# Patient Record
Sex: Male | Born: 1955 | Race: Black or African American | Hispanic: No | Marital: Married | Smoking: Never smoker
Health system: Southern US, Community
[De-identification: ages and names within clinical notes are randomized; demographics above are authoritative.]

## PROBLEM LIST (undated history)

## (undated) DIAGNOSIS — I10 Essential (primary) hypertension: Secondary | ICD-10-CM

## (undated) DIAGNOSIS — E7800 Pure hypercholesterolemia, unspecified: Secondary | ICD-10-CM

## (undated) HISTORY — PX: SHOULDER SURGERY: SHX246

---

## 2001-01-25 ENCOUNTER — Ambulatory Visit (HOSPITAL_BASED_OUTPATIENT_CLINIC_OR_DEPARTMENT_OTHER): Admission: RE | Admit: 2001-01-25 | Discharge: 2001-01-25 | Payer: Self-pay | Admitting: Orthopedic Surgery

## 2010-04-17 ENCOUNTER — Emergency Department (HOSPITAL_COMMUNITY): Admission: EM | Admit: 2010-04-17 | Discharge: 2010-04-17 | Payer: Self-pay | Admitting: Emergency Medicine

## 2010-06-23 ENCOUNTER — Encounter: Admission: RE | Admit: 2010-06-23 | Discharge: 2010-06-23 | Payer: Self-pay | Admitting: Family Medicine

## 2010-10-22 LAB — POCT I-STAT, CHEM 8
BUN: 11 mg/dL (ref 6–23)
Calcium, Ion: 1.24 mmol/L (ref 1.12–1.32)
Creatinine, Ser: 1 mg/dL (ref 0.4–1.5)
Glucose, Bld: 103 mg/dL — ABNORMAL HIGH (ref 70–99)
TCO2: 30 mmol/L (ref 0–100)

## 2010-12-16 ENCOUNTER — Other Ambulatory Visit: Payer: Self-pay | Admitting: Family Medicine

## 2010-12-16 DIAGNOSIS — R748 Abnormal levels of other serum enzymes: Secondary | ICD-10-CM

## 2010-12-25 NOTE — Op Note (Signed)
[place]  Patient:    Chad Contreras, Chad Contreras                        MRN: [id number] Proc. Date: 01/25/01 Adm. 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  PROCEDURE:  Left shoulder examination under anesthesia with assessment and debridement of abrasive tearing rotator cuff.  Arthroscopic acromioplasty with CA ligament release.  Excision distal clavicle.  SURGEON:  Loreta Ave, M.D.  ASSISTANT:  Arlys John D. Petrarca, P.A.-C.  ANESTHESIA:  General.  ESTIMATED BLOOD LOSS:  Minimal.  SPECIMENS:  None.  CULTURES:  None.  COMPLICATIONS:  None.  DRESSINGS:  Sterile compressive with sling.  PROCEDURE:  The patient was brought to the operating room and after adequate anesthesia had been obtained the left shoulder was examined.  Full motion, good stability.  Placed in beach chair position on the shoulder positioner. Prepped and draped in the usual sterile fashion.  Three standard arthroscopic portals, anterior, posterior, lateral.  Shoulder entered with blunt obturator and distended and inspected.  Glenohumeral joint looked good in regards to articular cartilage, labrum, capsular ligamentous structures.  Biceps tendon, biceps anchor intact.  Cuff from below looked excellent.  Cannula redirected subacromially.  Picture of chronic impingement with abrasive partial thickness tearing superior cuff.  No structural deep partial tears or full thickness tears.  Type 3 acromion.  Bursa resected.  Cuff debrided.  Acromioplasty to a type 1 acromion.  CA ligament incised and partially excised with cautery and shaver.  Distal clavicle had grade 4 changes with distal clavicle osteolysis.  The lateral 7  mm sharply resected with shaver and high speed bur.  Adequate COD compression and clavicle excision confirmed viewing from all portals.  Instruments and fluid removed. Portal, shoulder and bursa injected with Marcaine.  Portals closed with 4-0 nylon.  Sterile compressive dressing applied.  Sling applied.  Anesthesia reversed and brought to recovery room.  Tolerated surgery well, no complications. DD:  01/25/01 TD:  01/25/01 Job: [id number]

## 2011-11-16 IMAGING — CR DG CERVICAL SPINE COMPLETE 4+V
5 series · 5 of 5 positions shown · non-contrast
Comparison: The

CLINICAL DATA: Neck pain for 2 weeks.  Left arm numbness and pain.
No injury

CERVICAL SPINE - COMPLETE 4+ VIEW

[w c-spine lat]
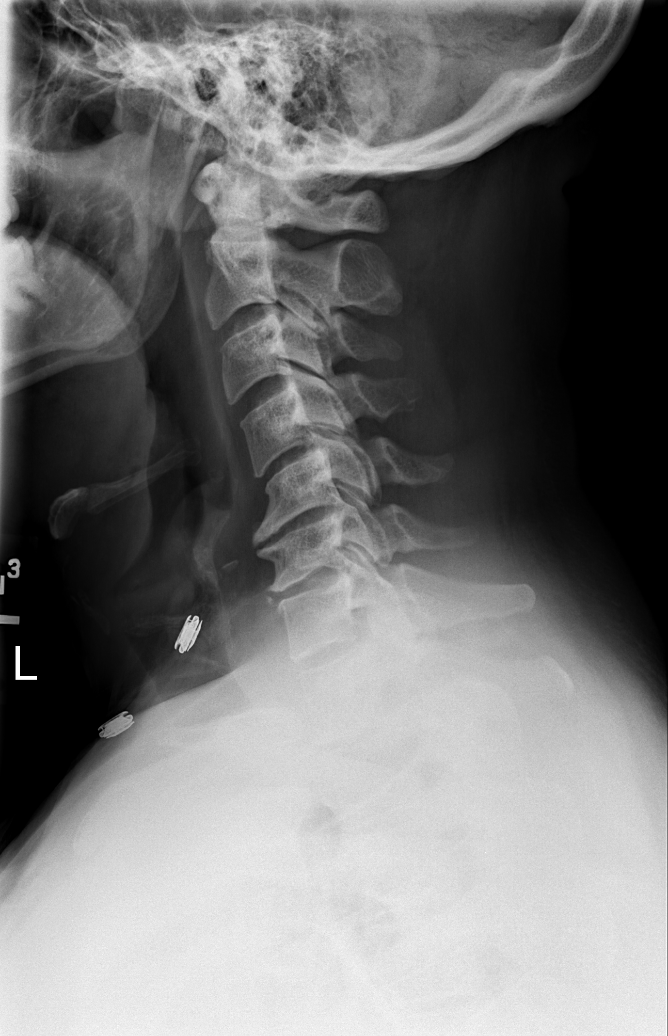

[w c-spine oblique (1 of 2)]
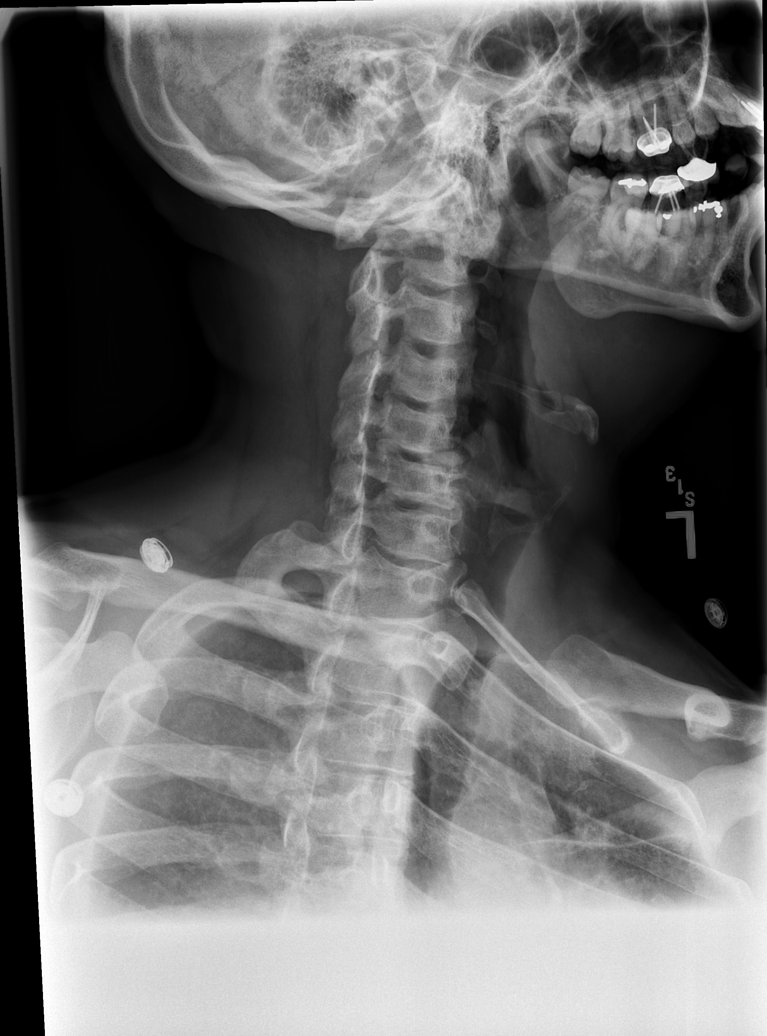

[w c-spine oblique (2 of 2)]
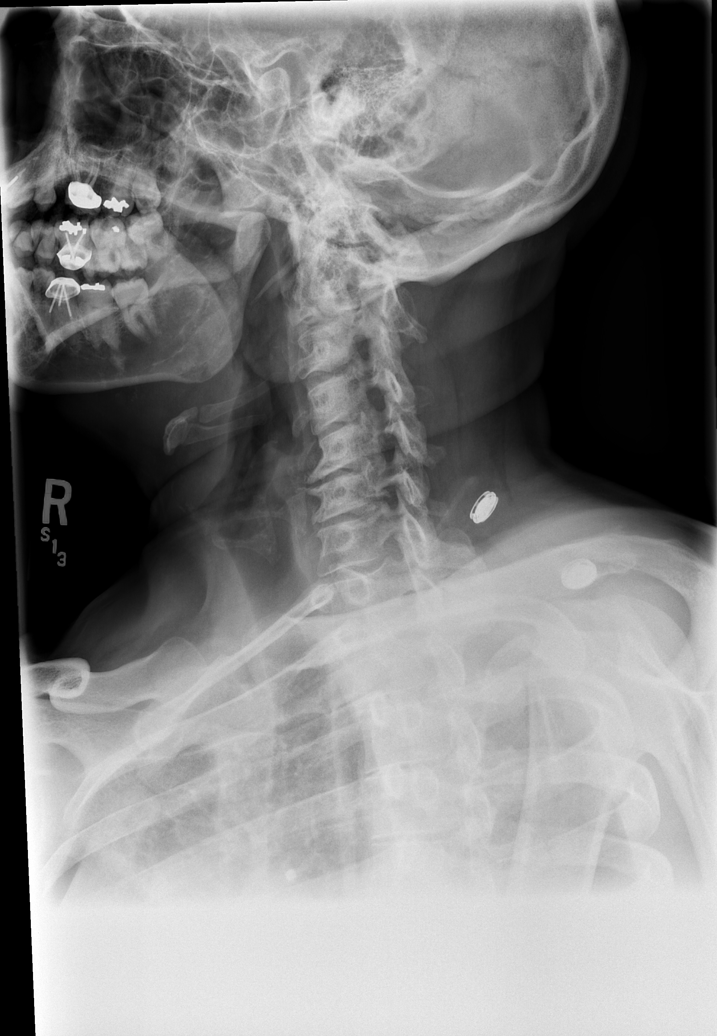

[w c-spine a.p.]
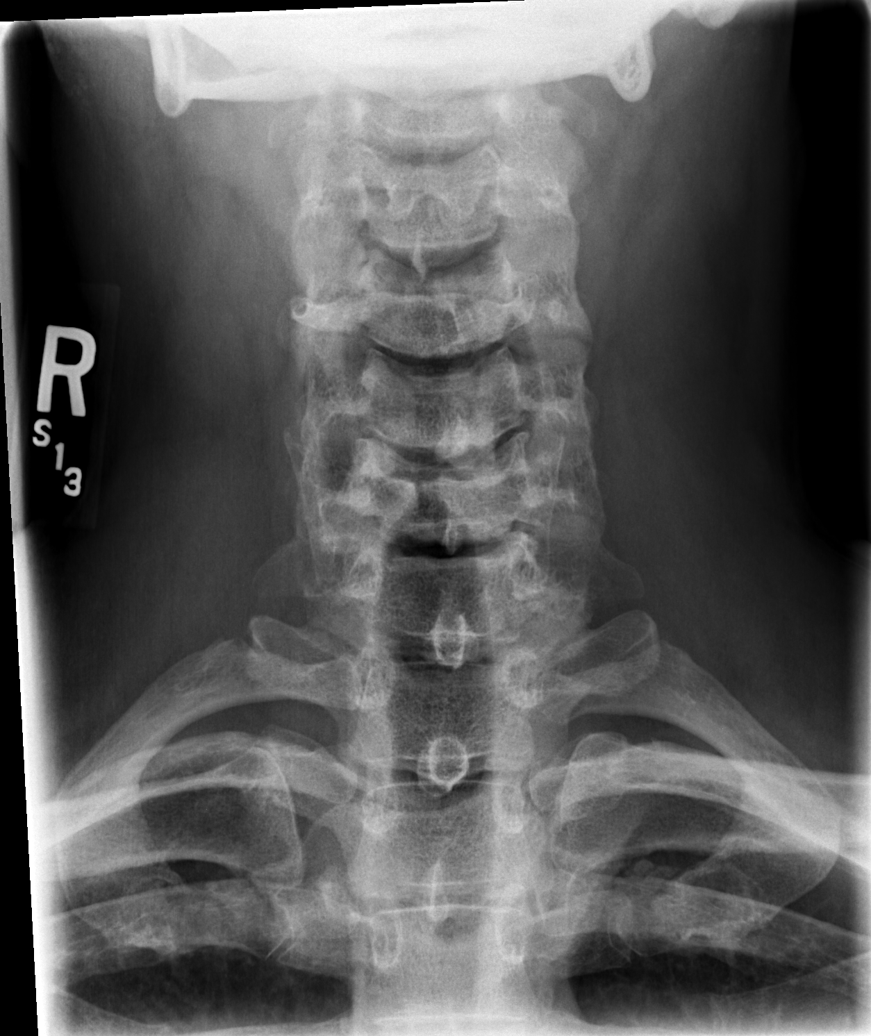

[w c-spine odontoid]
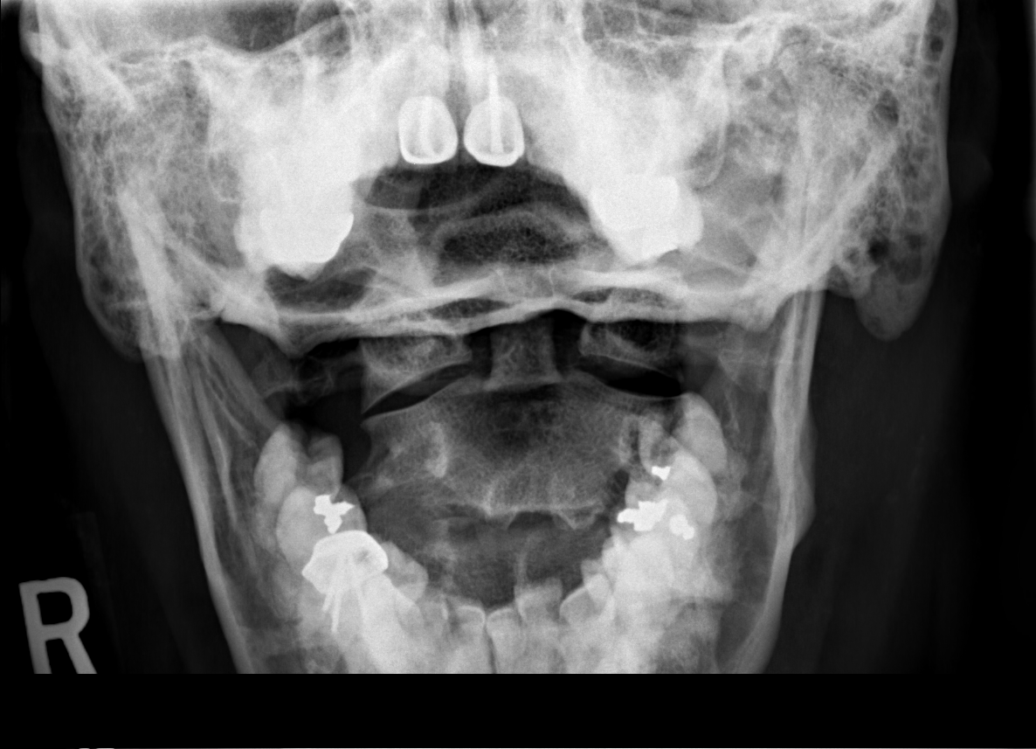

[5 of 5 positions shown; findings below may reference images not displayed]

FINDINGS: Mild kyphosis at C5-6.  There is moderate disc
degeneration and spurring at C5-6 causing mild to moderate
foraminal narrowing bilaterally.  Mild disc degeneration and
spurring at C4-5.  Left foraminal narrowing at C6-7 due to
spurring.

Negative for fracture or mass.
IMPRESSION: Cervical disc degeneration and spondylosis at C5-6 with foraminal
encroachment bilaterally due to spurring.  There is also  left
foraminal narrowing at C6-7 due to spurring.  Negative for
fracture.

## 2012-11-07 ENCOUNTER — Other Ambulatory Visit: Payer: Self-pay | Admitting: Family Medicine

## 2012-11-07 DIAGNOSIS — M7989 Other specified soft tissue disorders: Secondary | ICD-10-CM

## 2012-11-16 ENCOUNTER — Other Ambulatory Visit: Payer: Self-pay

## 2012-12-07 ENCOUNTER — Other Ambulatory Visit: Payer: Self-pay | Admitting: Family Medicine

## 2012-12-07 ENCOUNTER — Ambulatory Visit
Admission: RE | Admit: 2012-12-07 | Discharge: 2012-12-07 | Disposition: A | Discharge status: Home / Self Care | Payer: 59 | Source: Ambulatory Visit | Attending: Family Medicine | Admitting: Family Medicine

## 2012-12-07 DIAGNOSIS — M7989 Other specified soft tissue disorders: Secondary | ICD-10-CM

## 2012-12-08 ENCOUNTER — Other Ambulatory Visit: Payer: Self-pay | Admitting: Family Medicine

## 2012-12-08 DIAGNOSIS — R9389 Abnormal findings on diagnostic imaging of other specified body structures: Secondary | ICD-10-CM

## 2012-12-12 ENCOUNTER — Ambulatory Visit
Admission: RE | Admit: 2012-12-12 | Discharge: 2012-12-12 | Disposition: A | Discharge status: Home / Self Care | Payer: 59 | Source: Ambulatory Visit | Attending: Family Medicine | Admitting: Family Medicine

## 2012-12-12 DIAGNOSIS — R9389 Abnormal findings on diagnostic imaging of other specified body structures: Secondary | ICD-10-CM

## 2012-12-12 MED ORDER — GADOBENATE DIMEGLUMINE 529 MG/ML IV SOLN
20.0000 mL | Freq: Once | INTRAVENOUS | Status: AC | PRN
  Administered 2012-12-12: 20 mL via INTRAVENOUS

## 2013-04-02 ENCOUNTER — Other Ambulatory Visit: Payer: Self-pay | Admitting: Family Medicine

## 2013-04-05 ENCOUNTER — Ambulatory Visit
Admission: RE | Admit: 2013-04-05 | Discharge: 2013-04-05 | Disposition: A | Discharge status: Home / Self Care | Payer: 59 | Source: Ambulatory Visit | Attending: Family Medicine | Admitting: Family Medicine

## 2014-04-02 ENCOUNTER — Ambulatory Visit (HOSPITAL_BASED_OUTPATIENT_CLINIC_OR_DEPARTMENT_OTHER): Payer: 59 | Attending: Family Medicine

## 2014-04-02 VITALS — Ht 71.75 in | Wt 211.0 lb

## 2014-04-02 DIAGNOSIS — G4733 Obstructive sleep apnea (adult) (pediatric): Secondary | ICD-10-CM | POA: Diagnosis present

## 2014-04-06 DIAGNOSIS — G4733 Obstructive sleep apnea (adult) (pediatric): Secondary | ICD-10-CM

## 2014-04-06 NOTE — Sleep Study (Signed)
NAME: Chad Contreras DATE OF BIRTH:  May 24, 1956 MEDICAL RECORD NUMBER [id number]  LOCATION: [facility]  PHYSICIAN: YOUNG,CLINTON D  DATE OF STUDY: 04/02/2014  SLEEP STUDY TYPE: Nocturnal Polysomnogram               REFERRING PHYSICIAN: Emeterio Reeve, MD  INDICATION FOR STUDY:  hypersomnia with sleep apnea  EPWORTH SLEEPINESS SCORE:   4/24 HEIGHT: 5' 11.75" (182.2 cm)  WEIGHT: 211 lb (95.709 kg)    Body mass index is 28.83 kg/(m^2).  NECK SIZE: 17.5 in.  MEDICATIONS: Charted for review  SLEEP ARCHITECTURE: Total sleep time 297 minutes with sleep efficiency 81.7%. Stage I was 16.7%, stage II 63.3%, stage III 0.5%, REM 19.5% of total sleep time. Sleep latency 21 minutes, REM latency 61 minutes, awake after sleep onset 45 minutes, arousal index 15.6, bedtime medication: Losartan, Crestor  RESPIRATORY DATA: Apnea hypopneas index (AHI) 2.4 per hour. 12 total events scored including 2 central apneas and 10 hypopneas. Events were more common while supine. REM AHI of 4.1 per hour. There were not enough events to qualify for CPAP titration.  OXYGEN DATA: Mild to occasionally moderately loud snoring with oxygen desaturation to a nadir of 88% and mean saturation 94.3% on room air  CARDIAC DATA: Normal sinus rhythm  MOVEMENT/PARASOMNIA: No significant motor disturbance, no bathroom trips  IMPRESSION/ RECOMMENDATION:   1) Occasional respiratory events for sleep disturbance, within normal limits. AHI 2.4 per hour (the normal range for adults as an AHI from 0-5 events per hour). Mild to occasional moderately loud snoring with oxygen desaturation to a nadir of 88% and mean saturation 94.3% on room air.   [name] Diplomate, American Board of Sleep Medicine  ELECTRONICALLY SIGNED ON:  04/06/2014, 10:57 AM [facility] PH: [phone]   FX: [phone] ACCREDITED BY THE AMERICAN ACADEMY OF SLEEP MEDICINE

## 2016-04-16 ENCOUNTER — Encounter: Payer: Managed Care, Other (non HMO) | Attending: Family Medicine | Admitting: *Deleted

## 2016-04-16 DIAGNOSIS — E119 Type 2 diabetes mellitus without complications: Secondary | ICD-10-CM | POA: Insufficient documentation

## 2016-04-16 DIAGNOSIS — Z713 Dietary counseling and surveillance: Secondary | ICD-10-CM | POA: Diagnosis present

## 2016-04-16 NOTE — Patient Instructions (Signed)
Plan:  Aim for 5 Carb Choices per meal (75 grams) +/- 1 either way  Aim for 0-2 Carbs per snack if hungry  Include protein in moderation with your meals and snacks Consider reading food labels for Total Carbohydrate of foods Continue with your activity level daily as tolerated Consider checking BG at alternate times per day

## 2016-04-16 NOTE — Progress Notes (Signed)
Diabetes Self-Management Education  Visit Type: First/Initial  Appt. Start Time: 0800 Appt. End Time: 0930  04/16/2016  Mr. Chad Contreras, identified by name and date of birth, is a 60 y.o. male with a diagnosis of Diabetes: Type 2. He states his deceased mother had diabetes. He states he eats at home during the work week and eats out often on the weekends. He exercises at the gym for at least 60 minutes 5 days a week. He has a meter and has tested in the past but ran out of strips, so hasn't tested lately.  ASSESSMENT  Height 5' 11.75" (1.822 m), weight 214 lb (97.1 kg). Body mass index is 29.23 kg/m.      Diabetes Self-Management Education - 04/16/16 0814      Visit Information   Visit Type First/Initial     Initial Visit   Diabetes Type Type 2   Are you currently following a meal plan? No   Are you taking your medications as prescribed? Not on Medications   Date Diagnosed unkown     Health Coping   How would you rate your overall health? Good     Psychosocial Assessment   Patient Belief/Attitude about Diabetes Motivated to manage diabetes   Self-care barriers None   Self-management support Doctor's office   Other persons present Patient   Patient Concerns Nutrition/Meal planning;Glycemic Control   Special Needs None   Preferred Learning Style No preference indicated   Learning Readiness Ready   How often do you need to have someone help you when you read instructions, pamphlets, or other written materials from your doctor or pharmacy? 1 - Never   What is the last grade level you completed in school? [employer]     Pre-Education Assessment   Patient understands the diabetes disease and treatment process. Needs Instruction   Patient understands incorporating nutritional management into lifestyle. Needs Instruction   Patient undertands incorporating physical activity into lifestyle. Needs Instruction   Patient understands using medications safely. Needs Instruction   Patient understands monitoring blood glucose, interpreting and using results Needs Instruction   Patient understands prevention, detection, and treatment of acute complications. Needs Instruction   Patient understands prevention, detection, and treatment of chronic complications. Needs Instruction   Patient understands how to develop strategies to address psychosocial issues. Needs Instruction   Patient understands how to develop strategies to promote health/change behavior. Needs Instruction     Complications   Last HgB A1C per patient/outside source 6.6 %   How often do you check your blood sugar? 0 times/day (not testing)  ran out of strips, needs a Rx refill   Number of hypoglycemic episodes per month 0   Have you had a dilated eye exam in the past 12 months? Yes   Have you had a dental exam in the past 12 months? No   Are you checking your feet? Yes   How many days per week are you checking your feet? 2     Dietary Intake   Breakfast skips except on weekends: eggs, bacon, hash browns   Snack (morning) no   Lunch [place] sandwich, chips   Snack (afternoon) no   Dinner eats at home Mon thru Thursday, lean meat, vegetables, occasionally rice. weekends eat out more often wings, some fries salad   Snack (evening) chips, popcorn, pretzels   Beverage(s) water, beer     Exercise   Exercise Type Light (walking / raking leaves)  gym for an hour and on Monday  does Zumba   How many days per week to you exercise? 5   How many minutes per day do you exercise? 60   Total minutes per week of exercise 300     Patient Education   Previous Diabetes Education No   Disease state  Definition of diabetes, type 1 and 2, and the diagnosis of diabetes   Nutrition management  Role of diet in the treatment of diabetes and the relationship between the three main macronutrients and blood glucose level;Food label reading, portion sizes and measuring food.;Carbohydrate counting   Physical activity and  exercise  Role of exercise on diabetes management, blood pressure control and cardiac health.   Medications Other (comment)  NA   Monitoring Purpose and frequency of SMBG.;Identified appropriate SMBG and/or A1C goals.   Chronic complications Relationship between chronic complications and blood glucose control   Psychosocial adjustment Role of stress on diabetes     Individualized Goals (developed by patient)   Nutrition Follow meal plan discussed   Physical Activity Exercise 5-7 days per week   Medications Not Applicable   Monitoring  test blood glucose pre and post meals as discussed     Post-Education Assessment   Patient understands the diabetes disease and treatment process. Demonstrates understanding / competency   Patient understands incorporating nutritional management into lifestyle. Demonstrates understanding / competency   Patient undertands incorporating physical activity into lifestyle. Demonstrates understanding / competency   Patient understands monitoring blood glucose, interpreting and using results Demonstrates understanding / competency   Patient understands prevention, detection, and treatment of chronic complications. Demonstrates understanding / competency   Patient understands how to develop strategies to address psychosocial issues. Demonstrates understanding / competency   Patient understands how to develop strategies to promote health/change behavior. Demonstrates understanding / competency     Outcomes   Expected Outcomes Demonstrated interest in learning. Expect positive outcomes   Future DMSE 3-4 months   Program Status Completed      Individualized Plan for Diabetes Self-Management Training:   Learning Objective:  Patient will have a greater understanding of diabetes self-management. Patient education plan is to attend individual and/or group sessions per assessed needs and concerns.   Plan:   Patient Instructions  Plan:  Aim for 5 Carb Choices per  meal (75 grams) +/- 1 either way  Aim for 0-2 Carbs per snack if hungry  Include protein in moderation with your meals and snacks Consider reading food labels for Total Carbohydrate of foods Continue with your activity level daily as tolerated Consider checking BG at alternate times per day        Expected Outcomes:  Demonstrated interest in learning. Expect positive outcomes  Education material provided: Living Well with Diabetes, A1C conversion sheet, Meal plan card and Carbohydrate counting sheet  If problems or questions, patient to contact team via:  Phone and Email  Future DSME appointment: 3-4 months

## 2016-05-13 ENCOUNTER — Encounter: Payer: Self-pay | Admitting: Family Medicine

## 2016-06-04 ENCOUNTER — Emergency Department (HOSPITAL_BASED_OUTPATIENT_CLINIC_OR_DEPARTMENT_OTHER): Payer: Managed Care, Other (non HMO)

## 2016-06-04 ENCOUNTER — Emergency Department (HOSPITAL_BASED_OUTPATIENT_CLINIC_OR_DEPARTMENT_OTHER)
Admission: EM | Admit: 2016-06-04 | Discharge: 2016-06-04 | Disposition: A | Discharge status: Home / Self Care | Payer: Managed Care, Other (non HMO) | Attending: Emergency Medicine | Admitting: Emergency Medicine

## 2016-06-04 ENCOUNTER — Encounter (HOSPITAL_BASED_OUTPATIENT_CLINIC_OR_DEPARTMENT_OTHER): Payer: Self-pay

## 2016-06-04 DIAGNOSIS — I10 Essential (primary) hypertension: Secondary | ICD-10-CM | POA: Diagnosis not present

## 2016-06-04 DIAGNOSIS — R200 Anesthesia of skin: Secondary | ICD-10-CM | POA: Diagnosis not present

## 2016-06-04 HISTORY — DX: Essential (primary) hypertension: I10

## 2016-06-04 HISTORY — DX: Pure hypercholesterolemia, unspecified: E78.00

## 2016-06-04 LAB — CBC WITH DIFFERENTIAL/PLATELET
BASOS ABS: 0 10*3/uL (ref 0.0–0.1)
Basophils Relative: 0 %
EOS PCT: 1 %
Eosinophils Absolute: 0.1 10*3/uL (ref 0.0–0.7)
HCT: 40.3 % (ref 39.0–52.0)
HEMOGLOBIN: 13.4 g/dL (ref 13.0–17.0)
LYMPHS ABS: 1.8 10*3/uL (ref 0.7–4.0)
LYMPHS PCT: 26 %
MCH: 29.6 pg (ref 26.0–34.0)
MCHC: 33.3 g/dL (ref 30.0–36.0)
MCV: 89.2 fL (ref 78.0–100.0)
Monocytes Absolute: 0.4 10*3/uL (ref 0.1–1.0)
Monocytes Relative: 6 %
NEUTROS ABS: 4.6 10*3/uL (ref 1.7–7.7)
NEUTROS PCT: 67 %
PLATELETS: 193 10*3/uL (ref 150–400)
RBC: 4.52 MIL/uL (ref 4.22–5.81)
RDW: 12.2 % (ref 11.5–15.5)
WBC: 7 10*3/uL (ref 4.0–10.5)

## 2016-06-04 LAB — BASIC METABOLIC PANEL
ANION GAP: 7 (ref 5–15)
BUN: 14 mg/dL (ref 6–20)
CHLORIDE: 105 mmol/L (ref 101–111)
CO2: 27 mmol/L (ref 22–32)
Calcium: 9.5 mg/dL (ref 8.9–10.3)
Creatinine, Ser: 0.99 mg/dL (ref 0.61–1.24)
GFR calc Af Amer: >60 mL/min (ref >60)
GLUCOSE: 283 mg/dL — AB (ref 65–99)
POTASSIUM: 3.9 mmol/L (ref 3.5–5.1)
Sodium: 139 mmol/L (ref 135–145)

## 2016-06-04 NOTE — ED Notes (Signed)
IV attempted x2 without success.

## 2016-06-04 NOTE — ED Notes (Signed)
MD at bedside. 

## 2016-06-04 NOTE — ED Triage Notes (Addendum)
C/o pain then numbness to right UE x 2 weeks-denies injury-today has a "funny feeling" to entire face-pt was seen by PCP 2 weeks ago for right UE c/o=was dx "muscle something"-rx antiinflammatory and exercises"-pt is wearing velcro wrap to right elbow-NAD-steady gait

## 2016-06-04 NOTE — ED Notes (Signed)
Patient transported to CT 

## 2016-06-04 NOTE — Discharge Instructions (Signed)
Ibuprofen 600 mg 3 times daily for the next 5 days.  Follow-up with your primary Dr. if symptoms are not improving in the next 3-4 days.  Return to the ER if your symptoms worsen in the meantime.

## 2016-06-04 NOTE — ED Notes (Signed)
Pt seen at [facility] 2 weeks ago for shoulder pain and has been doing exercises for same. Pt states what concerned him today was a "heaviness" to his face that started at 9 this morning.

## 2016-06-04 NOTE — ED Provider Notes (Signed)
[facility] Note   CSN: [id number] Arrival date & time: 06/04/16  1058     History   Chief Complaint Chief Complaint  Patient presents with  . Numbness    HPI Chad Contreras is a 60 y.o. male.  Patient is a 60 year old male with past medical history of hypertension and high cholesterol. He presents for evaluation of right arm numbness that has been ongoing for several days. This began in the absence of any injury or trauma. He does report some discomfort in his right shoulder, however denies any chest pain or difficulty breathing. This morning he began to feel some numbness of his face and presents for evaluation of this. He is concerned about possibility of a stroke. He denies any headache or visual changes. He denies any weakness to his arm.   The history is provided by the patient.    Past Medical History:  Diagnosis Date  . High cholesterol   . Hypertension     There are no active problems to display for this patient.   Past Surgical History:  Procedure Laterality Date  . SHOULDER SURGERY         Home Medications    Prior to Admission medications   Medication Sig Start Date End Date Taking? Authorizing Provider  losartan (COZAAR) 50 MG tablet Take 50 mg by mouth daily.    Historical Provider, MD  rosuvastatin (CRESTOR) 20 MG tablet Take 20 mg by mouth daily.    Historical Provider, MD    Family History No family history on file.  Social History Social History  Substance Use Topics  . Smoking status: Never Smoker  . Smokeless tobacco: Never Used  . Alcohol use Yes     Comment: weekly     Allergies   Review of patient's allergies indicates no known allergies.   Review of Systems Review of Systems  All other systems reviewed and are negative.    Physical Exam Updated Vital Signs BP 181/94 (BP Location: Left Arm)   Pulse 77   Temp 98.1 F (36.7 C) (Oral)   Resp 18   Ht 5\' 11"  (1.803 m)   Wt 214 lb (97.1 kg)   SpO2  100%   BMI 29.85 kg/m   Physical Exam  Constitutional: He is oriented to person, place, and time. He appears well-developed and well-nourished. No distress.  HENT:  Head: Normocephalic and atraumatic.  Mouth/Throat: Oropharynx is clear and moist.  Eyes: EOM are normal. Pupils are equal, round, and reactive to light.  Neck: Normal range of motion. Neck supple.  Cardiovascular: Normal rate and regular rhythm.  Exam reveals no friction rub.   No murmur heard. Pulmonary/Chest: Effort normal and breath sounds normal. No respiratory distress. He has no wheezes. He has no rales.  Abdominal: Soft. Bowel sounds are normal. He exhibits no distension. There is no tenderness.  Musculoskeletal: Normal range of motion. He exhibits no edema.  He has full range of motion of the right shoulder and arm.  Neurological: He is alert and oriented to person, place, and time. No cranial nerve deficit. He exhibits normal muscle tone. Coordination normal.  Skin: Skin is warm and dry. He is not diaphoretic.  Nursing note and vitals reviewed.    ED Treatments / Results  Labs (all labs ordered are listed, but only abnormal results are displayed) Labs Reviewed  BASIC METABOLIC PANEL  CBC WITH DIFFERENTIAL/PLATELET    EKG  EKG Interpretation  Date/Time:  Friday June 04 2016  11:22:07 EDT Ventricular Rate:  75 PR Interval:    QRS Duration: 91 QT Interval:  379 QTC Calculation: 424 R Axis:   26 Text Interpretation:  Sinus rhythm Probable left atrial enlargement RSR' in V1 or V2, probably normal variant Probable left ventricular hypertrophy Confirmed by Wray Goehring  MD, Kylee Umana ([id number]) on 06/04/2016 11:29:59 AM       Radiology No results found.  Procedures Procedures (including critical care time)  Medications Ordered in ED Medications - No data to display   Initial Impression / Assessment and Plan / ED Course  I have reviewed the triage vital signs and the nursing notes.  Pertinent labs &  imaging results that were available during my care of the patient were reviewed by me and considered in my medical decision making (see chart for details).  Clinical Course    Patient presents with complaints of right arm numbness that started several days ago. This appears to be a radiculopathy, Rather than an acute cerebrovascular event. His head CT is negative. He is complaining of some facial numbness that is bilateral. I am uncertain as to the significance of this, however I do not feel as though this is related to a stroke or central nervous system problem. He will be discharged with anti-inflammatories and advised to follow-up with his primary Dr. if not improving. He also understands to return if he worsens. In the meantime, he will be recommended to take a baby aspirin daily.  Final Clinical Impressions(s) / ED Diagnoses   Final diagnoses:  None    New Prescriptions New Prescriptions   No medications on file     Geoffery Lyons, MD 06/04/16 1317

## 2016-07-16 ENCOUNTER — Ambulatory Visit: Payer: Managed Care, Other (non HMO) | Admitting: *Deleted

## 2018-01-03 IMAGING — CR DG SHOULDER 2+V*R*
3 series · 3 of 3 positions shown · non-contrast
Comparison: None.

CLINICAL DATA: Right shoulder pain for 2 weeks, right arm numbness

EXAM:
RIGHT SHOULDER - 2+ VIEW

[w shoulder grashey right *]
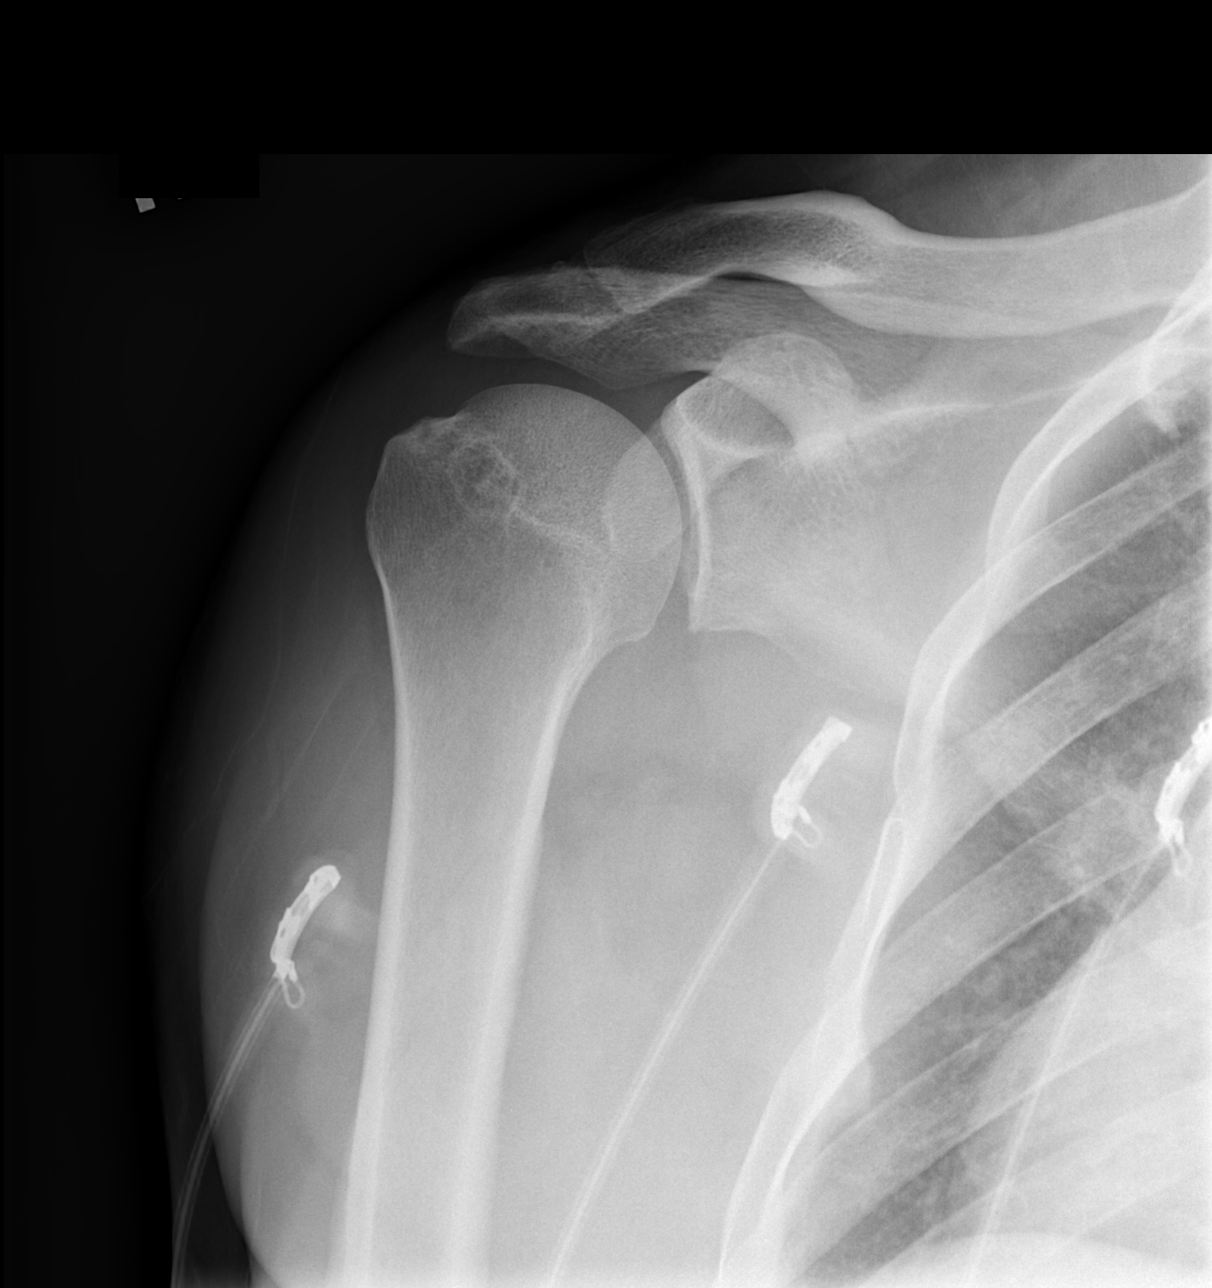

[w shoulder y view right *]
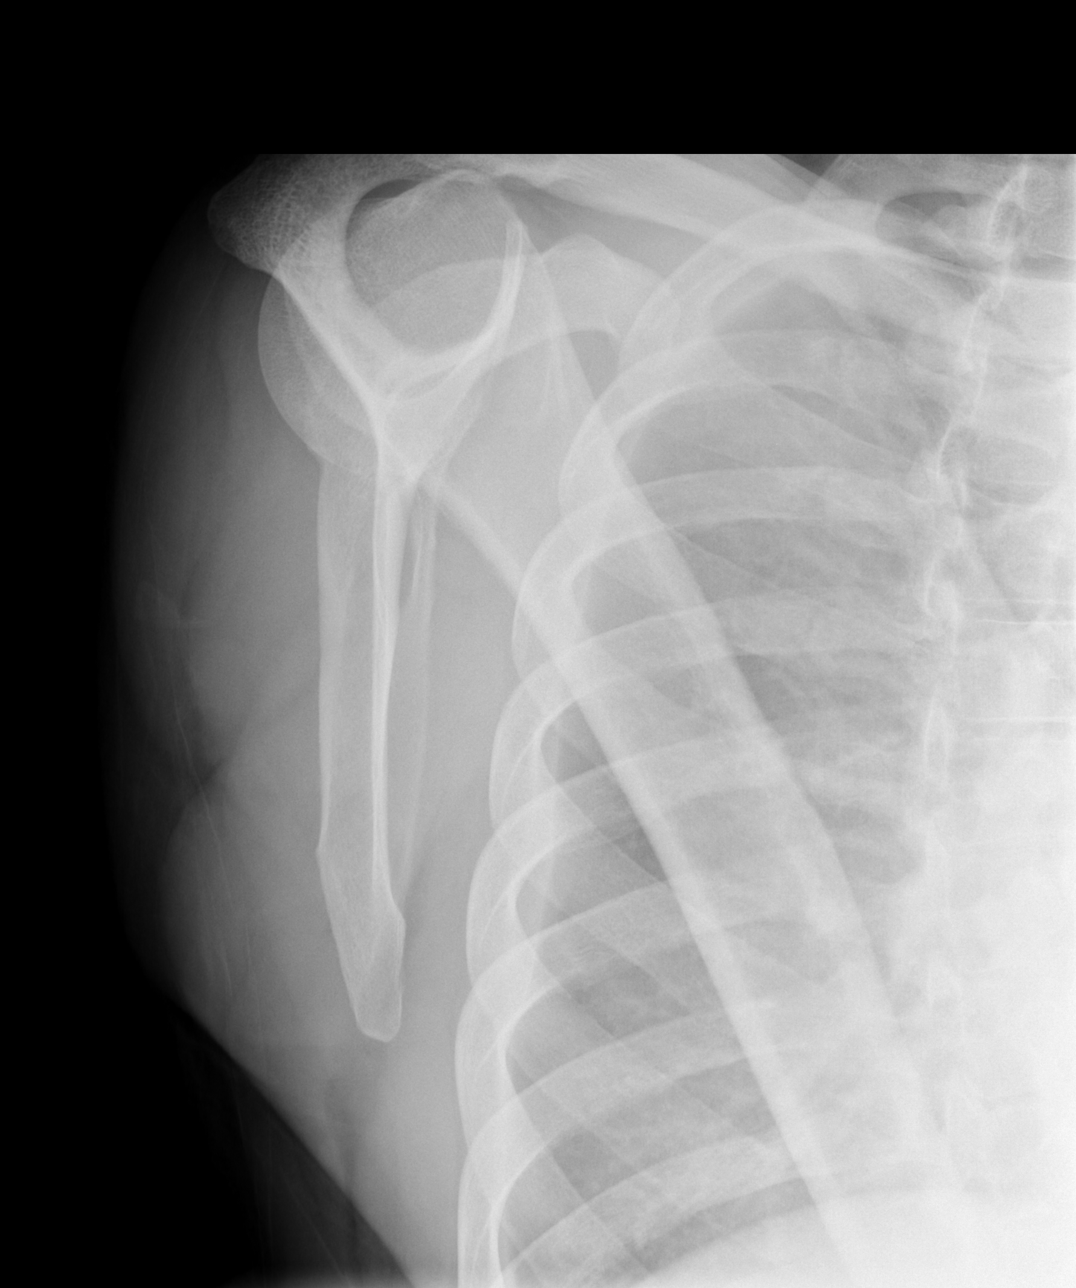

[x shoulder axillary right *]
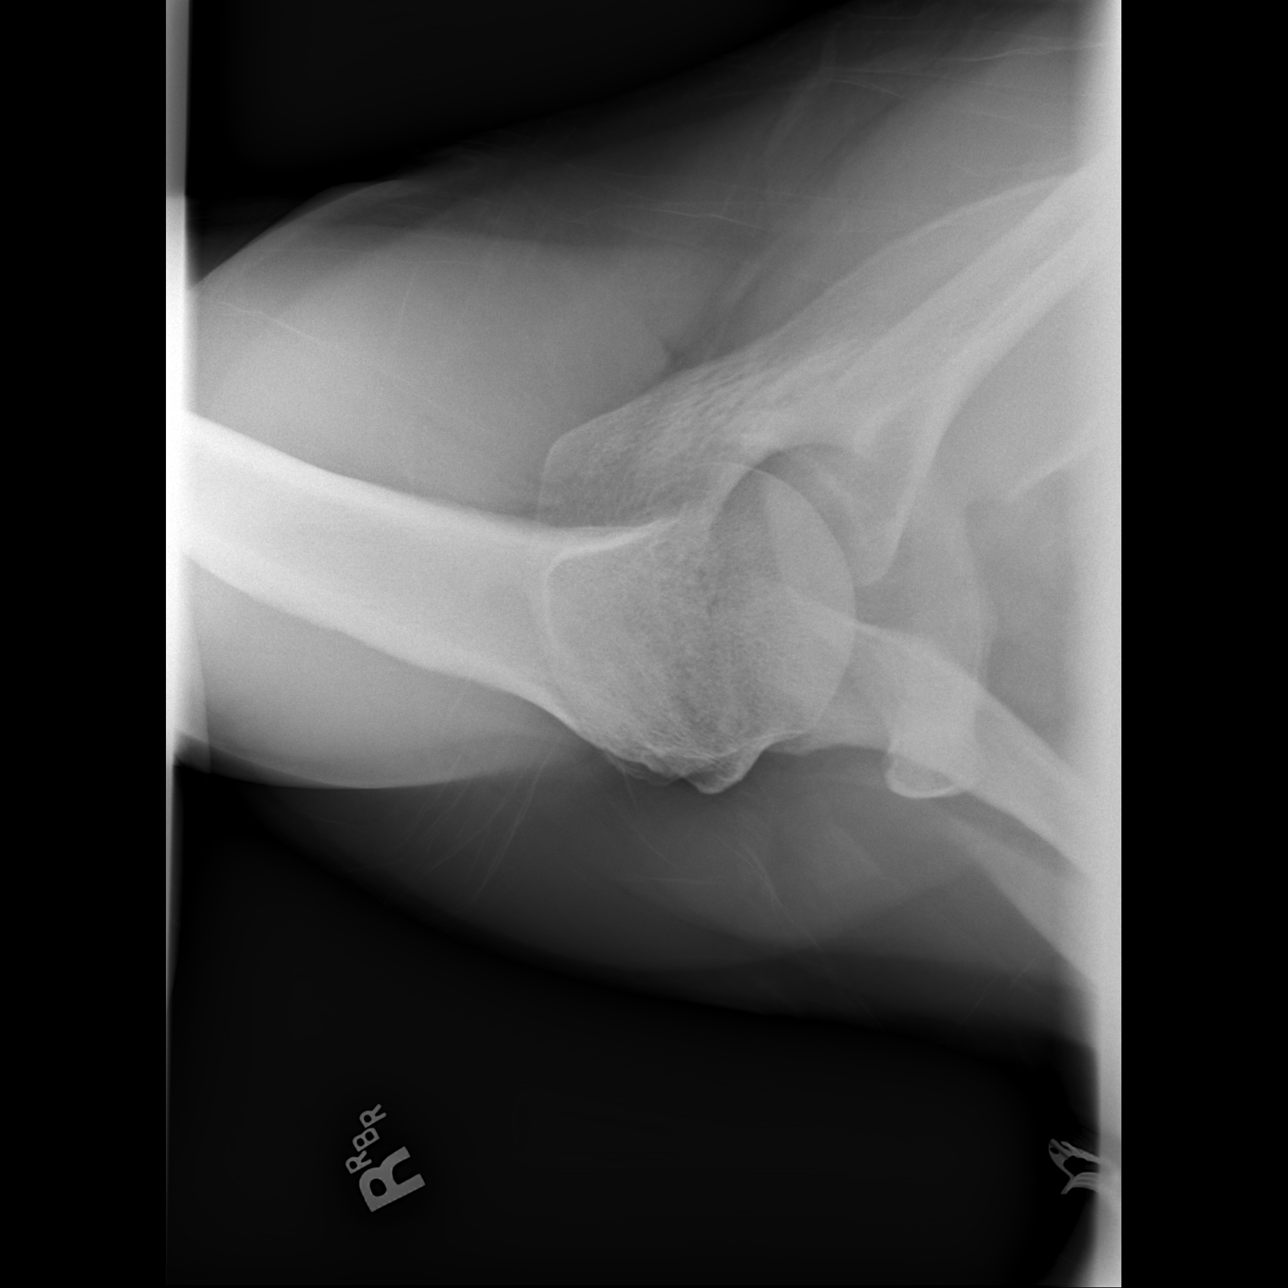

[3 of 3 positions shown; findings below may reference images not displayed]

FINDINGS: Three views of the right shoulder submitted. No acute fracture or
subluxation. Glenohumeral joint is preserved. Mild degenerative
changes AC joint.
IMPRESSION: No acute fracture or subluxation. Mild degenerative changes AC
joint.

## 2018-08-22 DIAGNOSIS — E7800 Pure hypercholesterolemia, unspecified: Secondary | ICD-10-CM | POA: Diagnosis not present

## 2018-08-22 DIAGNOSIS — E291 Testicular hypofunction: Secondary | ICD-10-CM | POA: Diagnosis not present

## 2018-08-22 DIAGNOSIS — R740 Nonspecific elevation of levels of transaminase and lactic acid dehydrogenase [LDH]: Secondary | ICD-10-CM | POA: Diagnosis not present

## 2018-08-22 DIAGNOSIS — E1122 Type 2 diabetes mellitus with diabetic chronic kidney disease: Secondary | ICD-10-CM | POA: Diagnosis not present

## 2018-10-31 DIAGNOSIS — E1122 Type 2 diabetes mellitus with diabetic chronic kidney disease: Secondary | ICD-10-CM | POA: Diagnosis not present

## 2022-07-08 DIAGNOSIS — H25013 Cortical age-related cataract, bilateral: Secondary | ICD-10-CM | POA: Diagnosis not present

## 2022-07-08 DIAGNOSIS — H26103 Unspecified traumatic cataract, bilateral: Secondary | ICD-10-CM | POA: Diagnosis not present

## 2022-07-08 DIAGNOSIS — E119 Type 2 diabetes mellitus without complications: Secondary | ICD-10-CM | POA: Diagnosis not present

## 2022-10-05 DIAGNOSIS — E1169 Type 2 diabetes mellitus with other specified complication: Secondary | ICD-10-CM | POA: Diagnosis not present

## 2023-01-13 DIAGNOSIS — E1165 Type 2 diabetes mellitus with hyperglycemia: Secondary | ICD-10-CM | POA: Diagnosis not present

## 2023-01-25 DIAGNOSIS — M25539 Pain in unspecified wrist: Secondary | ICD-10-CM | POA: Diagnosis not present

## 2023-01-25 DIAGNOSIS — M546 Pain in thoracic spine: Secondary | ICD-10-CM | POA: Diagnosis not present

## 2023-01-25 DIAGNOSIS — M542 Cervicalgia: Secondary | ICD-10-CM | POA: Diagnosis not present

## 2023-01-25 DIAGNOSIS — M545 Low back pain, unspecified: Secondary | ICD-10-CM | POA: Diagnosis not present

## 2023-02-07 ENCOUNTER — Other Ambulatory Visit: Payer: Self-pay | Admitting: Family Medicine

## 2023-02-07 ENCOUNTER — Ambulatory Visit
Admission: RE | Admit: 2023-02-07 | Discharge: 2023-02-07 | Disposition: A | Discharge status: Home / Self Care | Payer: Medicare Other | Source: Ambulatory Visit | Attending: Family Medicine | Admitting: Family Medicine

## 2023-02-07 DIAGNOSIS — E1122 Type 2 diabetes mellitus with diabetic chronic kidney disease: Secondary | ICD-10-CM | POA: Diagnosis not present

## 2023-02-07 DIAGNOSIS — S6991XA Unspecified injury of right wrist, hand and finger(s), initial encounter: Secondary | ICD-10-CM

## 2023-02-07 DIAGNOSIS — M25531 Pain in right wrist: Secondary | ICD-10-CM | POA: Diagnosis not present

## 2023-02-07 DIAGNOSIS — H00013 Hordeolum externum right eye, unspecified eyelid: Secondary | ICD-10-CM | POA: Diagnosis not present

## 2023-02-07 DIAGNOSIS — I10 Essential (primary) hypertension: Secondary | ICD-10-CM | POA: Diagnosis not present

## 2023-02-08 DIAGNOSIS — M25539 Pain in unspecified wrist: Secondary | ICD-10-CM | POA: Diagnosis not present

## 2023-02-08 DIAGNOSIS — M545 Low back pain, unspecified: Secondary | ICD-10-CM | POA: Diagnosis not present

## 2023-02-08 DIAGNOSIS — M542 Cervicalgia: Secondary | ICD-10-CM | POA: Diagnosis not present

## 2023-02-08 DIAGNOSIS — M546 Pain in thoracic spine: Secondary | ICD-10-CM | POA: Diagnosis not present

## 2023-03-02 DIAGNOSIS — M25539 Pain in unspecified wrist: Secondary | ICD-10-CM | POA: Diagnosis not present

## 2023-03-02 DIAGNOSIS — M545 Low back pain, unspecified: Secondary | ICD-10-CM | POA: Diagnosis not present

## 2023-03-02 DIAGNOSIS — M546 Pain in thoracic spine: Secondary | ICD-10-CM | POA: Diagnosis not present

## 2023-03-02 DIAGNOSIS — M542 Cervicalgia: Secondary | ICD-10-CM | POA: Diagnosis not present

## 2023-03-31 DIAGNOSIS — M25539 Pain in unspecified wrist: Secondary | ICD-10-CM | POA: Diagnosis not present

## 2023-03-31 DIAGNOSIS — M545 Low back pain, unspecified: Secondary | ICD-10-CM | POA: Diagnosis not present

## 2023-03-31 DIAGNOSIS — M546 Pain in thoracic spine: Secondary | ICD-10-CM | POA: Diagnosis not present

## 2023-03-31 DIAGNOSIS — M542 Cervicalgia: Secondary | ICD-10-CM | POA: Diagnosis not present

## 2023-04-13 ENCOUNTER — Other Ambulatory Visit: Payer: Self-pay | Admitting: Family Medicine

## 2023-04-13 DIAGNOSIS — Z0000 Encounter for general adult medical examination without abnormal findings: Secondary | ICD-10-CM | POA: Diagnosis not present

## 2023-04-13 DIAGNOSIS — Z136 Encounter for screening for cardiovascular disorders: Secondary | ICD-10-CM

## 2023-04-13 DIAGNOSIS — M25531 Pain in right wrist: Secondary | ICD-10-CM | POA: Diagnosis not present

## 2023-04-13 DIAGNOSIS — K7581 Nonalcoholic steatohepatitis (NASH): Secondary | ICD-10-CM | POA: Diagnosis not present

## 2023-04-13 DIAGNOSIS — E7800 Pure hypercholesterolemia, unspecified: Secondary | ICD-10-CM | POA: Diagnosis not present

## 2023-04-13 DIAGNOSIS — E1169 Type 2 diabetes mellitus with other specified complication: Secondary | ICD-10-CM | POA: Diagnosis not present

## 2023-04-13 DIAGNOSIS — K8020 Calculus of gallbladder without cholecystitis without obstruction: Secondary | ICD-10-CM | POA: Diagnosis not present

## 2023-04-13 DIAGNOSIS — Z79899 Other long term (current) drug therapy: Secondary | ICD-10-CM | POA: Diagnosis not present

## 2023-04-13 DIAGNOSIS — E119 Type 2 diabetes mellitus without complications: Secondary | ICD-10-CM | POA: Diagnosis not present

## 2023-04-21 DIAGNOSIS — M654 Radial styloid tenosynovitis [de Quervain]: Secondary | ICD-10-CM | POA: Diagnosis not present

## 2023-05-05 DIAGNOSIS — M545 Low back pain, unspecified: Secondary | ICD-10-CM | POA: Diagnosis not present

## 2023-05-05 DIAGNOSIS — M542 Cervicalgia: Secondary | ICD-10-CM | POA: Diagnosis not present

## 2023-05-05 DIAGNOSIS — M546 Pain in thoracic spine: Secondary | ICD-10-CM | POA: Diagnosis not present

## 2023-05-05 DIAGNOSIS — M25539 Pain in unspecified wrist: Secondary | ICD-10-CM | POA: Diagnosis not present

## 2023-05-23 DIAGNOSIS — M654 Radial styloid tenosynovitis [de Quervain]: Secondary | ICD-10-CM | POA: Diagnosis not present

## 2023-06-09 DIAGNOSIS — M542 Cervicalgia: Secondary | ICD-10-CM | POA: Diagnosis not present

## 2023-06-09 DIAGNOSIS — M25539 Pain in unspecified wrist: Secondary | ICD-10-CM | POA: Diagnosis not present

## 2023-06-09 DIAGNOSIS — M546 Pain in thoracic spine: Secondary | ICD-10-CM | POA: Diagnosis not present

## 2023-06-09 DIAGNOSIS — M545 Low back pain, unspecified: Secondary | ICD-10-CM | POA: Diagnosis not present

## 2023-07-01 DIAGNOSIS — Z23 Encounter for immunization: Secondary | ICD-10-CM | POA: Diagnosis not present

## 2023-07-01 DIAGNOSIS — E1165 Type 2 diabetes mellitus with hyperglycemia: Secondary | ICD-10-CM | POA: Diagnosis not present

## 2023-07-01 DIAGNOSIS — Z794 Long term (current) use of insulin: Secondary | ICD-10-CM | POA: Diagnosis not present

## 2023-07-14 DIAGNOSIS — E1165 Type 2 diabetes mellitus with hyperglycemia: Secondary | ICD-10-CM | POA: Diagnosis not present

## 2023-07-21 DIAGNOSIS — E119 Type 2 diabetes mellitus without complications: Secondary | ICD-10-CM | POA: Diagnosis not present

## 2023-07-21 DIAGNOSIS — H25013 Cortical age-related cataract, bilateral: Secondary | ICD-10-CM | POA: Diagnosis not present

## 2023-07-21 DIAGNOSIS — H524 Presbyopia: Secondary | ICD-10-CM | POA: Diagnosis not present

## 2023-07-21 DIAGNOSIS — H2513 Age-related nuclear cataract, bilateral: Secondary | ICD-10-CM | POA: Diagnosis not present

## 2023-07-27 ENCOUNTER — Other Ambulatory Visit: Payer: Self-pay | Admitting: Family Medicine

## 2023-07-27 DIAGNOSIS — R1904 Left lower quadrant abdominal swelling, mass and lump: Secondary | ICD-10-CM

## 2023-07-27 DIAGNOSIS — R1900 Intra-abdominal and pelvic swelling, mass and lump, unspecified site: Secondary | ICD-10-CM | POA: Diagnosis not present

## 2023-07-28 ENCOUNTER — Ambulatory Visit
Admission: RE | Admit: 2023-07-28 | Discharge: 2023-07-28 | Disposition: A | Discharge status: Home / Self Care | Payer: Medicare Other | Source: Ambulatory Visit | Attending: Family Medicine | Admitting: Family Medicine

## 2023-07-28 DIAGNOSIS — R1904 Left lower quadrant abdominal swelling, mass and lump: Secondary | ICD-10-CM

## 2023-07-28 DIAGNOSIS — R222 Localized swelling, mass and lump, trunk: Secondary | ICD-10-CM | POA: Diagnosis not present

## 2023-08-05 ENCOUNTER — Other Ambulatory Visit: Payer: Self-pay | Admitting: Family Medicine

## 2023-08-05 DIAGNOSIS — R1900 Intra-abdominal and pelvic swelling, mass and lump, unspecified site: Secondary | ICD-10-CM

## 2023-08-31 ENCOUNTER — Ambulatory Visit
Admission: RE | Admit: 2023-08-31 | Discharge: 2023-08-31 | Disposition: A | Discharge status: Home / Self Care | Payer: Medicare Other | Source: Ambulatory Visit | Attending: Family Medicine | Admitting: Family Medicine

## 2023-08-31 DIAGNOSIS — R1900 Intra-abdominal and pelvic swelling, mass and lump, unspecified site: Secondary | ICD-10-CM

## 2023-08-31 DIAGNOSIS — R1904 Left lower quadrant abdominal swelling, mass and lump: Secondary | ICD-10-CM | POA: Diagnosis not present

## 2023-08-31 MED ORDER — IOPAMIDOL (ISOVUE-300) INJECTION 61%
100.0000 mL | Freq: Once | INTRAVENOUS | Status: AC | PRN
  Administered 2023-08-31: 100 mL via INTRAVENOUS

## 2023-09-15 DIAGNOSIS — E1122 Type 2 diabetes mellitus with diabetic chronic kidney disease: Secondary | ICD-10-CM | POA: Diagnosis not present

## 2023-09-15 DIAGNOSIS — E1165 Type 2 diabetes mellitus with hyperglycemia: Secondary | ICD-10-CM | POA: Diagnosis not present

## 2023-12-07 DIAGNOSIS — M25519 Pain in unspecified shoulder: Secondary | ICD-10-CM | POA: Diagnosis not present

## 2023-12-12 DIAGNOSIS — M25519 Pain in unspecified shoulder: Secondary | ICD-10-CM | POA: Diagnosis not present

## 2023-12-15 DIAGNOSIS — M25519 Pain in unspecified shoulder: Secondary | ICD-10-CM | POA: Diagnosis not present

## 2023-12-19 DIAGNOSIS — M25519 Pain in unspecified shoulder: Secondary | ICD-10-CM | POA: Diagnosis not present

## 2024-01-04 DIAGNOSIS — M25519 Pain in unspecified shoulder: Secondary | ICD-10-CM | POA: Diagnosis not present

## 2024-01-08 DIAGNOSIS — M25519 Pain in unspecified shoulder: Secondary | ICD-10-CM | POA: Diagnosis not present

## 2024-01-11 DIAGNOSIS — E1129 Type 2 diabetes mellitus with other diabetic kidney complication: Secondary | ICD-10-CM | POA: Diagnosis not present

## 2024-01-11 DIAGNOSIS — E1165 Type 2 diabetes mellitus with hyperglycemia: Secondary | ICD-10-CM | POA: Diagnosis not present

## 2024-01-11 DIAGNOSIS — Z794 Long term (current) use of insulin: Secondary | ICD-10-CM | POA: Diagnosis not present

## 2024-01-11 DIAGNOSIS — R809 Proteinuria, unspecified: Secondary | ICD-10-CM | POA: Diagnosis not present

## 2024-01-12 DIAGNOSIS — M25519 Pain in unspecified shoulder: Secondary | ICD-10-CM | POA: Diagnosis not present

## 2024-01-23 DIAGNOSIS — M25519 Pain in unspecified shoulder: Secondary | ICD-10-CM | POA: Diagnosis not present

## 2024-02-20 DIAGNOSIS — M25519 Pain in unspecified shoulder: Secondary | ICD-10-CM | POA: Diagnosis not present

## 2024-04-13 DIAGNOSIS — E119 Type 2 diabetes mellitus without complications: Secondary | ICD-10-CM | POA: Diagnosis not present

## 2024-04-18 DIAGNOSIS — Z0000 Encounter for general adult medical examination without abnormal findings: Secondary | ICD-10-CM | POA: Diagnosis not present

## 2024-04-18 DIAGNOSIS — E785 Hyperlipidemia, unspecified: Secondary | ICD-10-CM | POA: Diagnosis not present

## 2024-04-18 DIAGNOSIS — Z23 Encounter for immunization: Secondary | ICD-10-CM | POA: Diagnosis not present

## 2024-04-18 DIAGNOSIS — Q825 Congenital non-neoplastic nevus: Secondary | ICD-10-CM | POA: Diagnosis not present

## 2024-04-18 DIAGNOSIS — M6208 Separation of muscle (nontraumatic), other site: Secondary | ICD-10-CM | POA: Diagnosis not present

## 2024-04-18 DIAGNOSIS — L608 Other nail disorders: Secondary | ICD-10-CM | POA: Diagnosis not present

## 2024-04-18 DIAGNOSIS — E1122 Type 2 diabetes mellitus with diabetic chronic kidney disease: Secondary | ICD-10-CM | POA: Diagnosis not present

## 2024-04-18 DIAGNOSIS — I10 Essential (primary) hypertension: Secondary | ICD-10-CM | POA: Diagnosis not present

## 2024-08-20 NOTE — Progress Notes (Signed)
 Chad Contreras                                          MRN: [id number]   08/20/2024   The [facility] Quality Team Specialist reviewed this patient medical record for the purposes of chart review for care gap closure. The following were reviewed: abstraction for care gap closure-glycemic status assessment.    [facility] Quality Team
